# Patient Record
Sex: Male | Born: 1970 | Race: Black or African American | Hispanic: No | Marital: Married
Health system: Southern US, Community
[De-identification: ages and names within clinical notes are randomized; demographics above are authoritative.]

---

## 1999-02-14 ENCOUNTER — Encounter (HOSPITAL_COMMUNITY): Admission: RE | Admit: 1999-02-14 | Discharge: 1999-03-14 | Payer: Self-pay

## 2000-01-02 ENCOUNTER — Emergency Department (HOSPITAL_COMMUNITY): Admission: EM | Admit: 2000-01-02 | Discharge: 2000-01-03 | Payer: Self-pay | Admitting: Emergency Medicine

## 2000-01-11 ENCOUNTER — Encounter: Payer: Self-pay | Admitting: Emergency Medicine

## 2000-01-11 ENCOUNTER — Emergency Department (HOSPITAL_COMMUNITY): Admission: EM | Admit: 2000-01-11 | Discharge: 2000-01-11 | Payer: Self-pay | Admitting: Emergency Medicine

## 2001-07-23 ENCOUNTER — Emergency Department (HOSPITAL_COMMUNITY): Admission: EM | Admit: 2001-07-23 | Discharge: 2001-07-23 | Payer: Self-pay | Admitting: Emergency Medicine

## 2001-07-23 ENCOUNTER — Encounter: Payer: Self-pay | Admitting: Emergency Medicine

## 2002-11-25 ENCOUNTER — Emergency Department (HOSPITAL_COMMUNITY): Admission: EM | Admit: 2002-11-25 | Discharge: 2002-11-25 | Payer: Self-pay | Admitting: Emergency Medicine

## 2003-04-25 ENCOUNTER — Inpatient Hospital Stay (HOSPITAL_COMMUNITY): Admission: AC | Admit: 2003-04-25 | Discharge: 2003-05-04 | Payer: Self-pay

## 2003-04-25 ENCOUNTER — Encounter: Payer: Self-pay | Admitting: Emergency Medicine

## 2003-04-28 ENCOUNTER — Encounter: Payer: Self-pay | Admitting: Orthopedic Surgery

## 2003-09-17 ENCOUNTER — Ambulatory Visit (HOSPITAL_COMMUNITY): Admission: RE | Admit: 2003-09-17 | Discharge: 2003-09-17 | Payer: Self-pay | Admitting: Orthopedic Surgery

## 2003-10-11 ENCOUNTER — Inpatient Hospital Stay (HOSPITAL_COMMUNITY): Admission: RE | Admit: 2003-10-11 | Discharge: 2003-10-13 | Payer: Self-pay | Admitting: Orthopedic Surgery

## 2005-05-06 ENCOUNTER — Inpatient Hospital Stay (HOSPITAL_COMMUNITY): Admission: EM | Admit: 2005-05-06 | Discharge: 2005-05-09 | Payer: Self-pay | Admitting: Emergency Medicine

## 2005-05-23 ENCOUNTER — Ambulatory Visit: Payer: Self-pay | Admitting: Family Medicine

## 2005-06-06 ENCOUNTER — Ambulatory Visit: Payer: Self-pay | Admitting: *Deleted

## 2005-08-07 ENCOUNTER — Ambulatory Visit: Payer: Self-pay | Admitting: Family Medicine

## 2009-01-12 ENCOUNTER — Ambulatory Visit: Payer: Self-pay | Admitting: Gastroenterology

## 2009-01-12 DIAGNOSIS — R195 Other fecal abnormalities: Secondary | ICD-10-CM | POA: Insufficient documentation

## 2009-01-14 ENCOUNTER — Telehealth (INDEPENDENT_AMBULATORY_CARE_PROVIDER_SITE_OTHER): Payer: Self-pay | Admitting: *Deleted

## 2009-01-14 LAB — CONVERTED CEMR LAB
BUN: 8 mg/dL (ref 6–23)
Basophils Relative: 4.4 % — ABNORMAL HIGH (ref 0.0–3.0)
CO2: 30 meq/L (ref 19–32)
Chloride: 108 meq/L (ref 96–112)
Creatinine, Ser: 1 mg/dL (ref 0.4–1.5)
Eosinophils Relative: 3.4 % (ref 0.0–5.0)
Glucose, Bld: 113 mg/dL — ABNORMAL HIGH (ref 70–99)
Hemoglobin: 14.7 g/dL (ref 13.0–17.0)
Lymphocytes Relative: 43 % (ref 12.0–46.0)
MCHC: 34.9 g/dL (ref 30.0–36.0)
Monocytes Relative: 12.8 % — ABNORMAL HIGH (ref 3.0–12.0)
Neutro Abs: 0.9 10*3/uL — ABNORMAL LOW (ref 1.4–7.7)
Potassium: 4 meq/L (ref 3.5–5.1)
WBC: 2.4 10*3/uL — ABNORMAL LOW (ref 4.5–10.5)

## 2009-01-17 ENCOUNTER — Ambulatory Visit: Payer: Self-pay | Admitting: Gastroenterology

## 2009-01-18 ENCOUNTER — Telehealth (INDEPENDENT_AMBULATORY_CARE_PROVIDER_SITE_OTHER): Payer: Self-pay | Admitting: *Deleted

## 2009-01-18 DIAGNOSIS — D72819 Decreased white blood cell count, unspecified: Secondary | ICD-10-CM | POA: Insufficient documentation

## 2009-01-18 LAB — CONVERTED CEMR LAB
Basophils Absolute: 0 10*3/uL (ref 0.0–0.1)
Eosinophils Absolute: 0.1 10*3/uL (ref 0.0–0.7)
HCT: 41.9 % (ref 39.0–52.0)
Lymphocytes Relative: 46.4 % — ABNORMAL HIGH (ref 12.0–46.0)
MCHC: 35.2 g/dL (ref 30.0–36.0)
Neutrophils Relative %: 36.6 % — ABNORMAL LOW (ref 43.0–77.0)
RDW: 12.3 % (ref 11.5–14.6)

## 2009-01-24 ENCOUNTER — Telehealth: Payer: Self-pay | Admitting: Gastroenterology

## 2009-01-26 ENCOUNTER — Ambulatory Visit: Payer: Self-pay | Admitting: Oncology

## 2009-02-08 ENCOUNTER — Telehealth (INDEPENDENT_AMBULATORY_CARE_PROVIDER_SITE_OTHER): Payer: Self-pay | Admitting: *Deleted

## 2009-02-10 ENCOUNTER — Encounter (INDEPENDENT_AMBULATORY_CARE_PROVIDER_SITE_OTHER): Payer: Self-pay | Admitting: *Deleted

## 2009-06-27 ENCOUNTER — Encounter (INDEPENDENT_AMBULATORY_CARE_PROVIDER_SITE_OTHER): Payer: Self-pay | Admitting: *Deleted

## 2009-06-27 ENCOUNTER — Emergency Department (HOSPITAL_COMMUNITY): Admission: EM | Admit: 2009-06-27 | Discharge: 2009-06-27 | Payer: Self-pay | Admitting: Emergency Medicine

## 2009-06-29 ENCOUNTER — Ambulatory Visit: Payer: Self-pay | Admitting: Gastroenterology

## 2009-06-30 ENCOUNTER — Telehealth (INDEPENDENT_AMBULATORY_CARE_PROVIDER_SITE_OTHER): Payer: Self-pay | Admitting: *Deleted

## 2009-07-01 ENCOUNTER — Telehealth: Payer: Self-pay | Admitting: Gastroenterology

## 2009-07-06 ENCOUNTER — Ambulatory Visit: Payer: Self-pay | Admitting: Gastroenterology

## 2010-05-10 IMAGING — CR DG ABDOMEN ACUTE W/ 1V CHEST
3 series · 3 of 3 positions shown · non-contrast
Comparison: Comparison chest x-ray 05/06/2005.

CLINICAL DATA: Nausea vomiting and abdominal pain.

ACUTE ABDOMEN SERIES (ABDOMEN 2 VIEW & CHEST 1 VIEW)

[w chest pa]
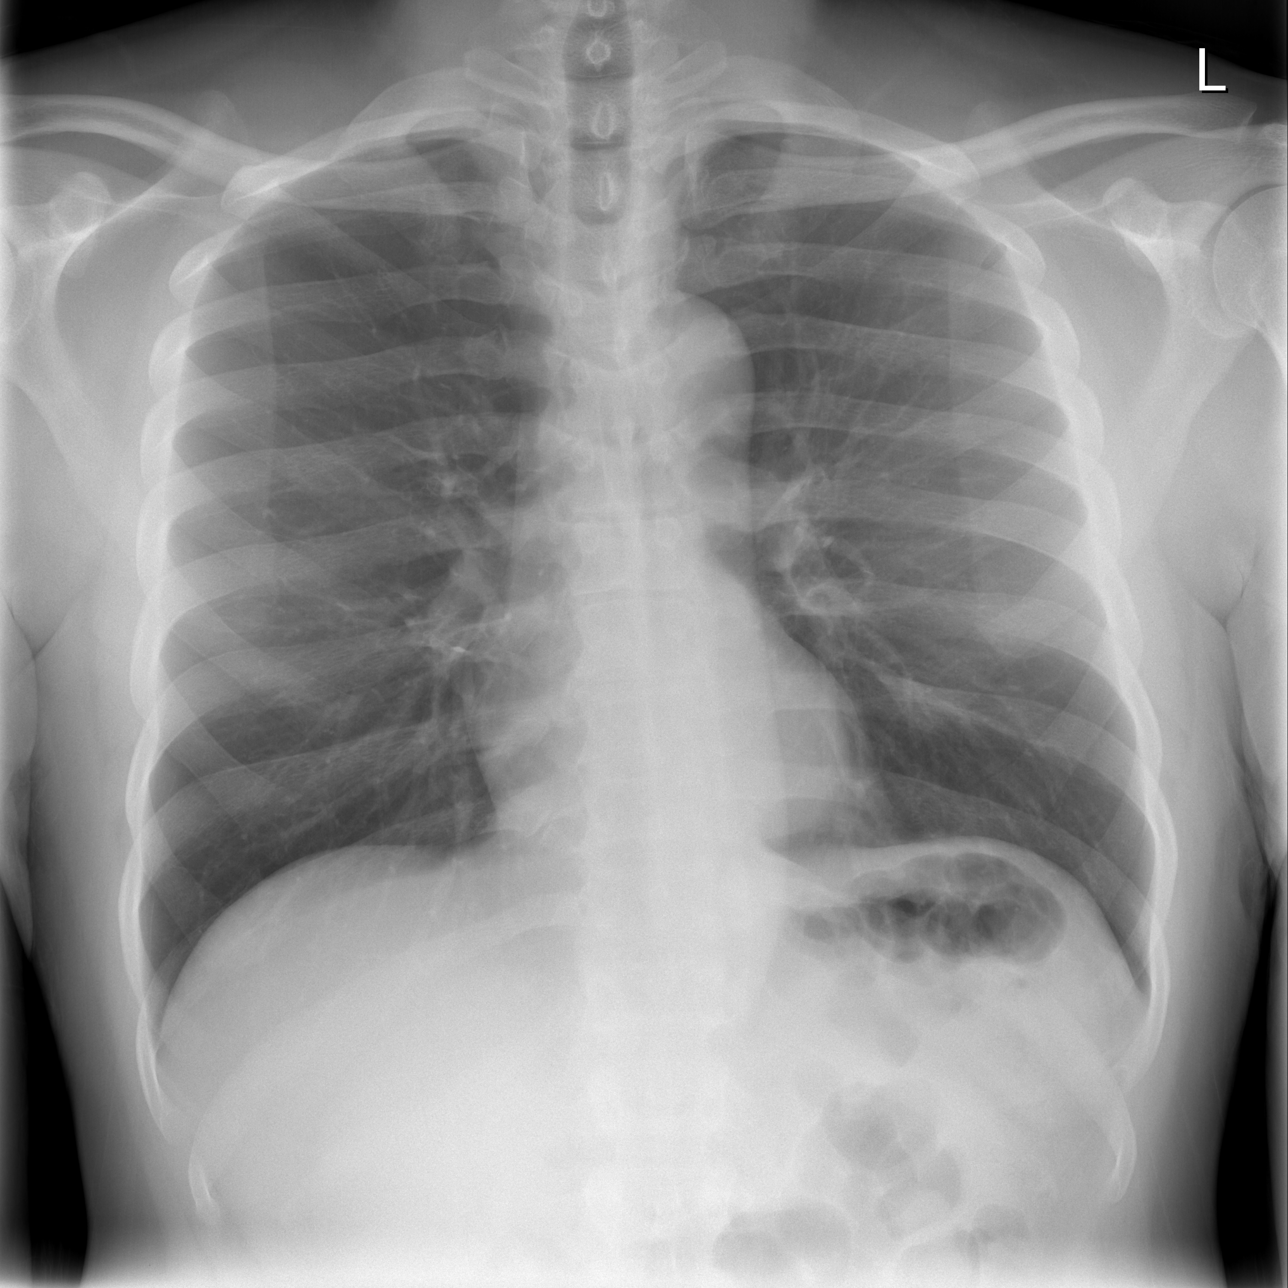

[w abdomen upright *]
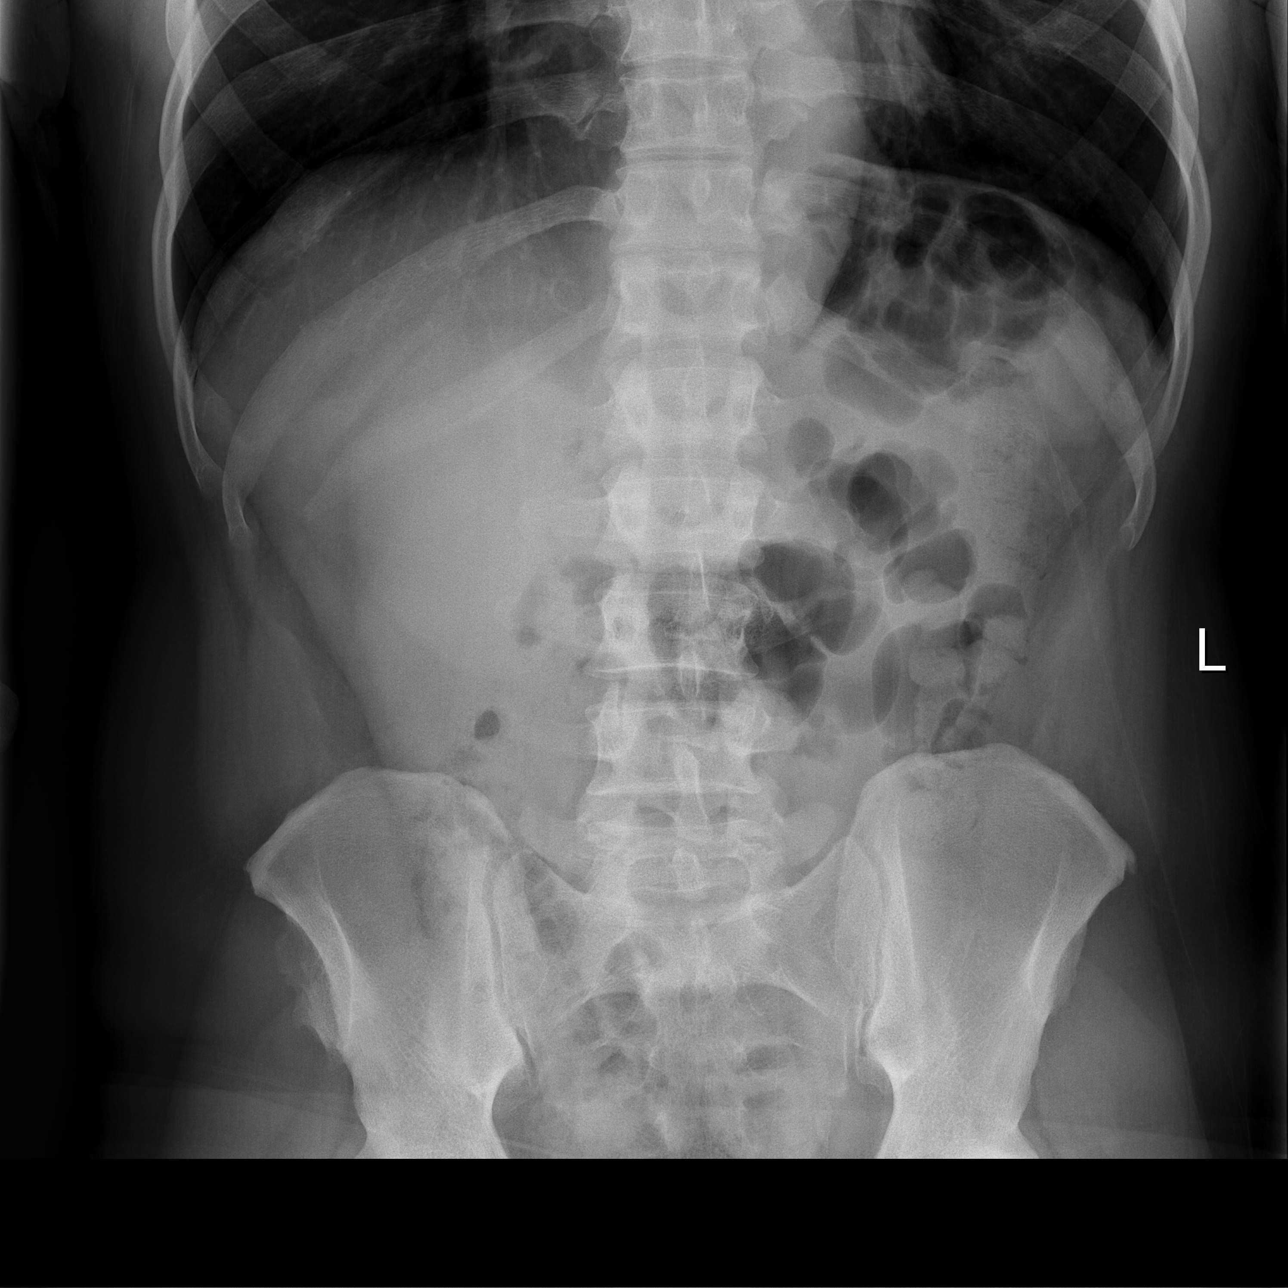

[t abdomen supine]
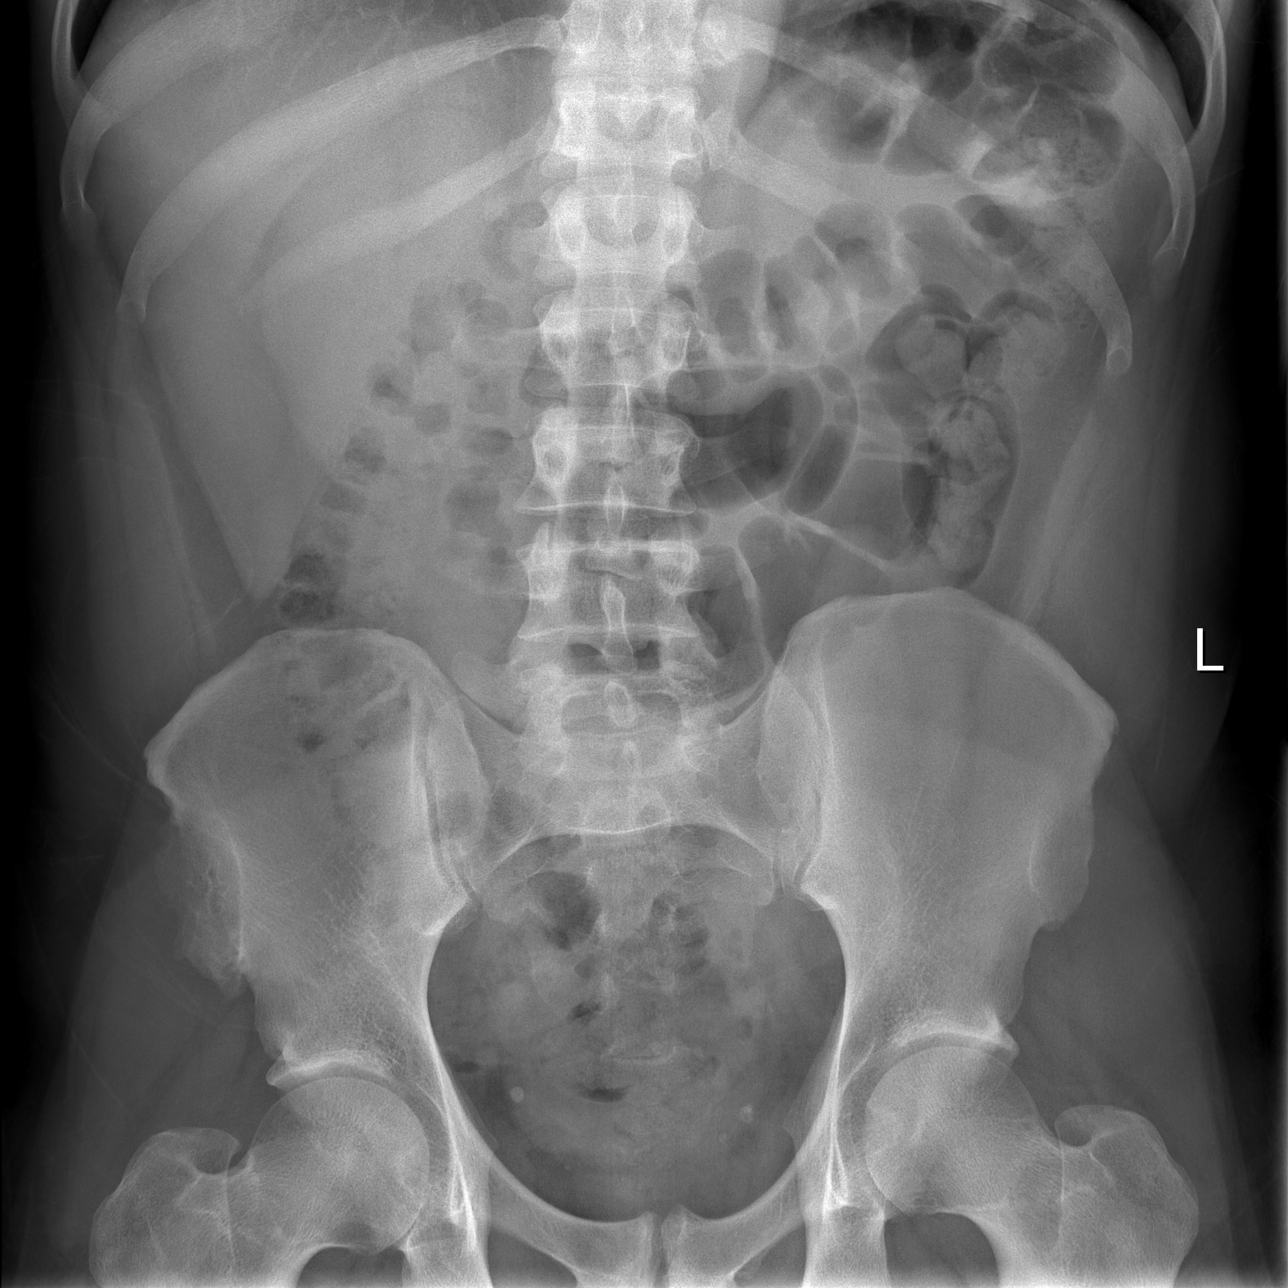

[3 of 3 positions shown; findings below may reference images not displayed]

FINDINGS: No infiltrate, congestive heart failure or pneumothorax.
Mediastinal and cardiac silhouette within normal limits.  No
evidence of bowel obstruction or free intraperitoneal air.
IMPRESSION: No evidence of obstruction or free air fluid.

## 2010-12-07 LAB — DIFFERENTIAL
Basophils Relative: 1 % (ref 0–1)
Eosinophils Absolute: 0.1 10*3/uL (ref 0.0–0.7)
Eosinophils Relative: 2 % (ref 0–5)
Lymphs Abs: 1.4 10*3/uL (ref 0.7–4.0)
Monocytes Absolute: 0.5 10*3/uL (ref 0.1–1.0)
Neutro Abs: 1.5 10*3/uL — ABNORMAL LOW (ref 1.7–7.7)

## 2010-12-07 LAB — URINALYSIS, ROUTINE W REFLEX MICROSCOPIC
Glucose, UA: NEGATIVE mg/dL
Nitrite: NEGATIVE
pH: 5 (ref 5.0–8.0)

## 2010-12-07 LAB — COMPREHENSIVE METABOLIC PANEL
ALT: 28 U/L (ref 0–53)
AST: 26 U/L (ref 0–37)
Alkaline Phosphatase: 63 U/L (ref 39–117)
Calcium: 9.1 mg/dL (ref 8.4–10.5)
GFR calc Af Amer: >60 mL/min (ref >60)
Potassium: 3.7 mEq/L (ref 3.5–5.1)
Sodium: 137 mEq/L (ref 135–145)
Total Protein: 6.7 g/dL (ref 6.0–8.3)

## 2010-12-07 LAB — CBC
Hemoglobin: 14 g/dL (ref 13.0–17.0)
MCHC: 34.5 g/dL (ref 30.0–36.0)
RBC: 4.33 MIL/uL (ref 4.22–5.81)
RDW: 13.5 % (ref 11.5–15.5)

## 2010-12-07 LAB — LIPASE, BLOOD: Lipase: 22 U/L (ref 11–59)

## 2010-12-07 LAB — HEMOCCULT GUIAC POC 1CARD (OFFICE): Fecal Occult Bld: POSITIVE
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
## 2011-01-19 NOTE — Discharge Summary (Signed)
Chad Mata, Chad Mata NO.:  [id number]   MEDICAL RECORD NO.:  [id number]          PATIENT TYPE:  INP   LOCATION:  1609                         FACILITY:  [facility]   PHYSICIAN:  Thomas A. Cornett, M.D.DATE OF BIRTH:  [birth year]   DATE OF ADMISSION:  05/06/2005  DATE OF DISCHARGE:  05/09/2005                                 DISCHARGE SUMMARY   ADDITIONAL DIAGNOSES:  Incarcerated ventral hernia.   DISCHARGE DIAGNOSES:  Incarcerated ventral hernia.   PROCEDURES:  Open repair of incarcerated ventral hernia with mesh.   BRIEF HISTORY:  The patient is a 40 year old male admitted with incarcerated  ventral hernia on May 06, 2005.  He was taken to the operating room for  repair of this.   HOSPITAL COURSE:  The patient's hospital course was unremarkable.  His pain  control was adequate on postoperative day #1 and #2 with IV analgesics and  then this was switched to p.o. analgesia.  His diet was advanced slowly. His  bowel function returned by postoperative day #3.  He had a JP drain  that_drained serous fluid and as removed on postop day #3.  His wound was  clean, dry and intact.  He was ambulating, tolerating his p.o. diet as well  as medications and his wound looked good.  He was discharged home in  satisfactory condition.   DISCHARGE INSTRUCTIONS:  He will be given a prescription for Percocet for  pain.  He will follow up in one week to have his staples removed and refrain  from heavy lifting and driving for 2-4 weeks.  He will shower when he gets  home and resume a regular diet.  I told him he may restart his Zantac when  he goes home.   CONDITION ON DISCHARGE:  Satisfactory.      Thomas A. Cornett, M.D.  Electronically Signed     TAC/MEDQ  D:  05/09/2005  T:  05/09/2005  Job:  [id number]
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
## 2011-03-09 ENCOUNTER — Other Ambulatory Visit: Payer: Self-pay | Admitting: Gastroenterology

## 2015-05-24 ENCOUNTER — Encounter: Payer: Self-pay | Admitting: Gastroenterology

## 2019-06-11 ENCOUNTER — Encounter: Payer: Self-pay | Admitting: Internal Medicine

## 2021-05-30 ENCOUNTER — Other Ambulatory Visit: Payer: Self-pay

## 2021-05-30 ENCOUNTER — Emergency Department (HOSPITAL_COMMUNITY)
Admission: EM | Admit: 2021-05-30 | Discharge: 2021-05-30 | Disposition: A | Discharge status: Home / Self Care | Payer: Self-pay | Attending: Emergency Medicine | Admitting: Emergency Medicine

## 2021-05-30 DIAGNOSIS — K130 Diseases of lips: Secondary | ICD-10-CM | POA: Diagnosis not present

## 2021-05-30 DIAGNOSIS — I10 Essential (primary) hypertension: Secondary | ICD-10-CM | POA: Insufficient documentation

## 2021-05-30 DIAGNOSIS — R220 Localized swelling, mass and lump, head: Secondary | ICD-10-CM

## 2021-05-30 DIAGNOSIS — Z91040 Latex allergy status: Secondary | ICD-10-CM | POA: Diagnosis not present

## 2021-05-30 MED ORDER — PREDNISONE 10 MG PO TABS: 20.0000 mg | ORAL_TABLET | Freq: Every day | ORAL | 0 refills | Status: DC

## 2021-05-30 MED ORDER — AMLODIPINE BESYLATE 5 MG PO TABS: 5.0000 mg | ORAL_TABLET | Freq: Every day | ORAL | 1 refills | Status: AC

## 2021-05-30 MED ORDER — PENICILLIN V POTASSIUM 500 MG PO TABS: 500.0000 mg | ORAL_TABLET | Freq: Four times a day (QID) | ORAL | 0 refills | Status: DC

## 2021-05-30 MED ORDER — PENICILLIN V POTASSIUM 500 MG PO TABS: 500.0000 mg | ORAL_TABLET | Freq: Four times a day (QID) | ORAL | 0 refills | Status: AC

## 2021-05-30 MED ORDER — FAMOTIDINE IN NACL 20-0.9 MG/50ML-% IV SOLN
20.0000 mg | Freq: Once | INTRAVENOUS | Status: AC
  Administered 2021-05-30: 20 mg via INTRAVENOUS
  Filled 2021-05-30: qty 50

## 2021-05-30 MED ORDER — AMLODIPINE BESYLATE 5 MG PO TABS
5.0000 mg | ORAL_TABLET | Freq: Once | ORAL | Status: AC
  Administered 2021-05-30: 5 mg via ORAL
  Filled 2021-05-30: qty 1

## 2021-05-30 MED ORDER — DIPHENHYDRAMINE HCL 50 MG/ML IJ SOLN
12.5000 mg | Freq: Once | INTRAMUSCULAR | Status: DC
  Filled 2021-05-30: qty 1

## 2021-05-30 MED ORDER — KETOROLAC TROMETHAMINE 30 MG/ML IJ SOLN
30.0000 mg | Freq: Once | INTRAMUSCULAR | Status: AC
  Administered 2021-05-30: 30 mg via INTRAVENOUS
  Filled 2021-05-30: qty 1

## 2021-05-30 MED ORDER — DEXAMETHASONE SODIUM PHOSPHATE 10 MG/ML IJ SOLN
10.0000 mg | Freq: Once | INTRAMUSCULAR | Status: AC
  Administered 2021-05-30: 10 mg via INTRAVENOUS
  Filled 2021-05-30: qty 1

## 2021-05-30 MED ORDER — LORATADINE 10 MG PO TABS: 10.0000 mg | ORAL_TABLET | Freq: Every day | ORAL | 0 refills | Status: AC

## 2021-05-30 NOTE — Discharge Instructions (Addendum)
Take penicillin 4 times daily for the next 7 days.  Schedule follow-up with the dental group listed above. Take Amlodipine once daily.  I have written you 1 refill, make sure you establish care with a primary care doctor to continue giving your medicine and to recheck your blood pressure. Take prednisone 40 mg daily for the next 5 days. Take claritin once daily 10 mg. If you have chest pain, difficulty breathing, a rash, vomiting or the swelling worsens return back to the ED

## 2021-05-30 NOTE — ED Provider Notes (Signed)
[facility]-EMERGENCY DEPT Provider Note   CSN: [id number] Arrival date & time: 05/30/21  5366     History Chief Complaint  Patient presents with   Oral Swelling    Chad Mata is a 50 y.o. male.  HPI  Patient presents with upper lip swelling.  This happened acutely, he woke up with swelling to the upper lip this morning. Feels like swelling is spreading to the surrounding face. Associated pain.  Reports that yesterday he was at a job site exposed to a lot of dust, also tried some of his sister's food. Thinks he got some food stuck in his upper teeth around when pain started.  No previous allergies to any foods, does have environmental allergies.  Has not tried any alleviating medications.  He is not having any nausea, vomiting, shortness of breath, chest pain, abdominal pain.  No history of allergic reaction requiring EpiPen use.  Patient has poor dentition, states he felt like one of his teeth went inside his upper lip yesterday when he was eating.  Denies any drainage or fevers.  Patient has not taken HTN medicine in 4 months due to be out.   No past medical history on file.  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     Home Medications Prior to Admission medications   Medication Sig Start Date End Date Taking? Authorizing Provider  pantoprazole (PROTONIX) 40 MG tablet TAKE ONE TABLET BY MOUTH TWICE DAILY 03/09/11   Rachael Fee, MD    Allergies    Latex  Review of Systems   Review of Systems  Constitutional:  Negative for fatigue and fever.  HENT:  Positive for dental problem and facial swelling. Negative for sore throat and voice change.   Respiratory:  Negative for shortness of breath.   Cardiovascular:  Negative for chest pain.  Gastrointestinal:  Negative for abdominal pain, nausea and vomiting.   Physical  Exam Updated Vital Signs BP (!) 174/140 (BP Location: Left Arm)   Pulse 96   Temp 97.9 F (36.6 C) (Oral)   Resp 16   SpO2 94%   Physical Exam Vitals and nursing note reviewed. Exam conducted with a chaperone present.  Constitutional:      General: He is not in acute distress.    Appearance: Normal appearance.  HENT:     Head: Normocephalic and atraumatic.     Mouth/Throat:     Mouth: Mucous membranes are moist.     Pharynx: No posterior oropharyngeal erythema.     Comments: Poor dentition. Upper lip swollen. Tender to palpation. No fluctuance.  Eyes:     General: No scleral icterus.    Extraocular Movements: Extraocular movements intact.     Pupils: Pupils are equal, round, and reactive to light.  Cardiovascular:     Rate and Rhythm: Normal rate and regular rhythm.  Skin:    Coloration: Skin is not jaundiced.  Neurological:     Mental Status: He is alert. Mental status is at baseline.     Coordination: Coordination normal.        ED Results / Procedures / Treatments   Labs (all labs ordered are listed, but only abnormal results are displayed) Labs Reviewed - No data to display  EKG None  Radiology No results found.  Procedures Procedures   Medications Ordered in ED  Medications - No data to display  ED Course  I have reviewed the triage vital signs and the nursing notes.  Pertinent labs & imaging results that were available during my care of the patient were reviewed by me and considered in my medical decision making (see chart for details).  Clinical Course as of 05/30/21 0956  Tue May 30, 2021  0728 BP(!): 174/140 139/103 in room [HS]  3183 50 year old male complaining of upper lip swelling when he woke up this morning.  He thought yesterday that he got some food stuck in his upper tooth.  Complaining of mild headache.  No prior history of this kind of swelling although he does said he had an abscess in the past.  No difficulty breathing or swallowing.   Getting symptomatic treatment and period of observation. [MB]    Clinical Course User Index [HS] Theron Arista, PA-C [MB] Terrilee Files, MD   MDM Rules/Calculators/A&P                           Angioedema versus allergic reaction versus dental process.  No anaphylaxis.  Doubt dental abscess based on exam, but patient does have poor dentition.  We will proceed to treat with antibiotics preemptively and have him follow-up with the dental group.  Patient feels the swelling is improving with medicine.  Objectively it looks the same.  His vital signs are stable though he is mildly hypertensive.  Given a dose of amlodipine here in the ED because he has been off his medication for 4 months.  We will also give him a refill as well as primary care follow-up in a dental group for follow-up.  We will start the patient on antibiotics as well as given medicine for the allergic reaction.  Return precautions were given and discussed at length.  Patient is aware if he starts having chest pain, shortness of breath, nausea, vomiting, increasing swelling, difficulty keeping fluids down to return back to the ED.  Discussed HPI, physical exam and plan of care for this patient with attending Meridee Score. The attending physician evaluated this patient as part of a shared visit and agrees with plan of care.   Final Clinical Impression(s) / ED Diagnoses Final diagnoses:  None    Rx / DC Orders ED Discharge Orders     None        Theron Arista, Cordelia Poche 05/30/21 3235    Terrilee Files, MD 05/30/21 [phone]

## 2021-05-30 NOTE — ED Triage Notes (Signed)
Pt reports waking up to a swollen lip. Pt states that he ate some of his sister's food last night but does not know of anything that could of caused it.

## 2021-12-13 ENCOUNTER — Ambulatory Visit: Payer: Self-pay | Admitting: *Deleted

## 2021-12-13 ENCOUNTER — Telehealth: Payer: 59 | Admitting: Physician Assistant

## 2021-12-13 NOTE — Progress Notes (Signed)
The patient no-showed for appointment despite this provider sending direct link with no response and waiting for at least 10 minutes from appointment time for patient to join. They will be marked as a NS for this appointment/time.   Marvion Bastidas M Umi Mainor, PA-C    

## 2021-12-13 NOTE — Telephone Encounter (Signed)
? ? ?  Chief Complaint: Allergies, sinus ?Symptoms: stuffy nose, sinus pain, acid reflux ?Frequency: Worsening past few weeks. H/O seasonal allergies, Claritin ineffective ?Pertinent Negatives: Patient denies SOB ?Disposition: [] ED /[] Urgent Care (no appt availability in office) / [] Appointment(In office/virtual)/ [x]  [facility]/ [] Home Care/ [] Refused Recommended Disposition /[] [facility]/ []  Follow-up with PCP ?Additional Notes: Advised UC/ED for SOB ?Pt is not with caller. Will call pt. Friend asks to wait "About 10 minutes to call, I will let him know you will be calling."  ? ?Reason for Disposition ? [1] Sinus congestion (pressure, fullness) AND [2] present > 10 days ? ?Answer Assessment - Initial Assessment Questions ?1. LOCATION: "Where does it hurt?"  ?    Sinus  ?2. ONSET: "When did the sinus pain start?"  (e.g., hours, days)  ?    Weeks, worsening. Had for years ?3. SEVERITY: "How bad is the pain?"   (Scale 1-10; mild, moderate or severe) ?  - MILD (1-3): doesn't interfere with normal activities  ?  - MODERATE (4-7): interferes with normal activities (e.g., work or school) or awakens from sleep ?  - SEVERE (8-10): excruciating pain and patient unable to do any normal activities    ?     ?4. RECURRENT SYMPTOM: "Have you ever had sinus problems before?" If Yes, ask: "When was the last time?" and "What happened that time?"  ?    Allergies ?5. NASAL CONGESTION: "Is the nose blocked?" If Yes, ask: "Can you open it or must you breathe through your mouth?" ?    Stuffy ?6. NASAL DISCHARGE: "Do you have discharge from your nose?" If so ask, "What color?" ?     ?7. FEVER: "Do you have a fever?" If Yes, ask: "What is it, how was it measured, and when did it start?"  ?     ?8. OTHER SYMPTOMS: "Do you have any other symptoms?" (e.g., sore throat, cough, earache, difficulty breathing) ?    Nose stuffy, acid reflux, Claritin not working ? ?Protocols used: Sinus Pain or Congestion-A-AH ? ?

## 2022-04-09 ENCOUNTER — Ambulatory Visit: Payer: 59 | Admitting: Critical Care Medicine
